# Patient Record
Sex: Male | Born: 2000 | Race: White | Hispanic: No | Marital: Single | Smoking: Never smoker
Health system: Southern US, Community
[De-identification: ages and names within clinical notes are randomized; demographics above are authoritative.]

## PROBLEM LIST (undated history)

## (undated) DIAGNOSIS — F909 Attention-deficit hyperactivity disorder, unspecified type: Secondary | ICD-10-CM

## (undated) DIAGNOSIS — T7840XA Allergy, unspecified, initial encounter: Secondary | ICD-10-CM

## (undated) DIAGNOSIS — J302 Other seasonal allergic rhinitis: Secondary | ICD-10-CM

## (undated) DIAGNOSIS — R569 Unspecified convulsions: Secondary | ICD-10-CM

## (undated) HISTORY — PX: INGUINAL HERNIA REPAIR: SUR1180

## (undated) HISTORY — PX: CIRCUMCISION: SUR203

## (undated) HISTORY — DX: Attention-deficit hyperactivity disorder, unspecified type: F90.9

## (undated) HISTORY — PX: HERNIA REPAIR: SHX51

## (undated) HISTORY — PX: TESTICLE SURGERY: SHX794

## (undated) HISTORY — DX: Allergy, unspecified, initial encounter: T78.40XA

## (undated) HISTORY — PX: TYMPANOSTOMY TUBE PLACEMENT: SHX32

## (undated) HISTORY — DX: Unspecified convulsions: R56.9

---
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
## 2022-01-01 ENCOUNTER — Ambulatory Visit (INDEPENDENT_AMBULATORY_CARE_PROVIDER_SITE_OTHER): Payer: BC Managed Care – PPO | Admitting: Pediatrics

## 2022-01-01 VITALS — Ht 64.5 in | Wt 113.0 lb

## 2022-01-01 DIAGNOSIS — R634 Abnormal weight loss: Secondary | ICD-10-CM

## 2022-01-01 DIAGNOSIS — F902 Attention-deficit hyperactivity disorder, combined type: Secondary | ICD-10-CM

## 2022-01-01 DIAGNOSIS — F4325 Adjustment disorder with mixed disturbance of emotions and conduct: Secondary | ICD-10-CM

## 2022-01-01 DIAGNOSIS — F819 Developmental disorder of scholastic skills, unspecified: Secondary | ICD-10-CM | POA: Diagnosis not present

## 2022-01-01 DIAGNOSIS — R278 Other lack of coordination: Secondary | ICD-10-CM

## 2022-01-01 DIAGNOSIS — Z79899 Other long term (current) drug therapy: Secondary | ICD-10-CM

## 2022-01-01 MED ORDER — METHYLPHENIDATE HCL ER (CD) 50 MG PO CPCR: 50.0000 mg | ORAL_CAPSULE | ORAL | 0 refills | Status: DC

## 2022-01-01 NOTE — Patient Instructions (Addendum)
[place] [phone] [address] Barbara Cower [phone]   You can request more information at this web address: [email or web address]  Counselors that might have sliding scale fees  [facility] [place].  "[facility]"    [facility] [place]. [address]    [place]  [place] [phone]

## 2022-01-01 NOTE — Progress Notes (Signed)
[facility] [place]. [address] [place] [address] Dept: [phone] Dept Fax: [phone]  Medication Check  Patient ID:  Jonathan Scott  male DOB: 2000-04-18   21 y.o.   MRN: [id number]   DATE:01/01/22  PCP: Christain Sacramento, MD  Accompanied by: Mother  HISTORY/CURRENT STATUS: Jonathan Scott is here for medication management of the psychoactive medications for ADHD with anxiety/depression with adjustment disorder and review of educational and behavioral concerns. Jonathan Scott is currently taking Metadate CD 50 Q AM. Has been able to get the prescription filled now. Takes it about 9-10:30 and eats breakfast. Only eats lunch part of the time. He eats dinner with his mother and eats a good size dinner. Most days he only eats 2 meals. Listens to podcasts/music, helps mom with projects around the house. He has been studying for his drivers test. Medicine wears off about 6-7 PM. Thinking about getting a job, but wants to be hired where the boss treats the workers right.   Sleeping well (watches TV, goes to bed at 11-12 am asleep before 1 PM, wakes at 9:30 AM), sleeping through the night. Does have delayed sleep onset.   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 Contact information was given him at the last visit but he has not contacted VOC Rehab. Mother wants him to have updated Psychoeducational testing and discussed this is where to get the help that Jonathan Scott needs. Counseling provided  Thinking about jobs at [employer], [employer] or [employer].  MEDICAL HISTORY: Individual Medical History/ Review of Systems: Healthy,  has needed no trips to the PCP. NO Seizures.  [place] done last year  Family Medical/ Social History: Patient Lives with: mother Mother is unemployed now.   MENTAL HEALTH: Mental Health Issues:   Depression and Anxiety Times feeling depressed, "why am I even here?".  Wonders why he is so depressed Anxious, too.  Worried about his mom being out of work.  Worrying about her health. Completed the PHQ-9 depression screener with a total score of 10 (moderate depression).  Completed the GAD-7 anxiety screener with a total score of 8 (mild anxiety). Worried about money since mother is out of work, worried cannot afford to get therapist, worried about the insurance Discussed that some counselors take "sliding scale" patients when money is tight or no insurance. Encouraged Jonathan Scott to reach out to some community providers to see if they provide "sliding scale" care     01/01/2022    3:48 PM 07/02/2021    4:54 PM 04/22/2021   11:24 AM  PHQ9 SCORE ONLY  PHQ-9 Total Score 10 10 5       01/01/2022    3:50 PM 07/02/2021    4:55 PM 04/22/2021   11:24 AM 01/03/2021    4:42 PM  GAD 7 : Generalized Anxiety Score  Nervous, Anxious, on Edge 1 2 1 1   Control/stop worrying 1 1 1  0  Worry too much - different things 1 1 1 1   Trouble relaxing 1 1 1 1   Restless 1 1 0 0  Easily annoyed or irritable 2 2 3 2   Afraid - awful might happen  1 1 2 1   Total GAD 7 Score 8 9 9 6    Allergies: Allergies  Allergen Reactions   Amoxicillin Rash and Swelling    Current Medications:  Current Outpatient Medications on File Prior to Visit  Medication Sig Dispense Refill   diazepam (DIASTAT ACUDIAL) 10 MG GEL GIVE 10 MG RECTUALLY FOR SEIZURES THAT LAST 2 MIN OR MORE (Patient not taking: Reported on 05/30/2020) 10 mg 0   levETIRAcetam (KEPPRA) 500 MG tablet TAKE 1&1/2 TABLETS TWICE DAILY 270 tablet 1   methylphenidate (METADATE CD) 50 MG CR capsule Take 1 capsule (50 mg total) by mouth every morning. 30 capsule 0   methylphenidate  (RITALIN) 5 MG tablet Take 1 tablet (5 mg total) by mouth as directed. Daily at 3-5 PM for homework or behavior 30 tablet 0   No current facility-administered medications on file prior to visit.    Medication Side Effects: Appetite Suppression and Sleep Problems  DIAGNOSES:    ICD-10-CM   1. ADHD (attention deficit hyperactivity disorder), combined type  F90.2 methylphenidate (METADATE CD) 50 MG CR capsule    2. General learning disability  F81.9     3. 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 Loss of weight  R63.4     6. Medication management  Z79.899      ASSESSMENT:  ADHD well controlled with medication management, however, continues to have side effects of medication, i.e., sleep and appetite concerns with weight loss. Anxiety and Depression are keeping him from following through on plans for young adult independence activities. Recommend individual counseling, and connection with 06/01/2020 for Psychoeducational Testing, Job evaluation and training. Jonathan Scott allowed his mother into the appointment today so she could hear the recommendations, too.   RECOMMENDATIONS:  Discussed recent history and today's examination with patient/parent. Have considered a trial of SSRI for anxiety/depression but most are contraindicated in patients with seizures. 11-10-1999 NP in [facility] will help with selection if needed.   Counseled regarding  growth and development.   7 %ile (Z= -1.51) based on CDC (Boys, 2-20 Years) BMI-for-age based on BMI available as of 01/01/2022. Will continue to monitor.   Recommended contacting vocational rehabilitation for psychoeducational evaluation, job evaluation, job [facility].  Recommended Jonathan Scott seek individual counseling from a counselor who provides sliding-scale fees.  Discussed this concept with the patient and his mother.  Discussed need for bedtime  routine, use of good sleep hygiene, no video games, TV or phones for an hour before bedtime.   Counseled medication pharmacokinetics, options, dosage, administration, desired effects, and possible side effects.   Metadate CD 50 mg every morning after breakfast E-Prescribed  directly to  [id number] [place] [place] - [address] [place] [address] [place] Phone: [phone] Fax: [phone]  REVIEW OF CHART, FACE TO FACE CLINIC TIME AND DOCUMENTATION TIME DURING TODAY'S VISIT:  30 min     NEXT APPOINTMENT: Return to PCP for health monitoring and medication management.

## 2022-01-21 ENCOUNTER — Other Ambulatory Visit (INDEPENDENT_AMBULATORY_CARE_PROVIDER_SITE_OTHER): Payer: Self-pay | Admitting: Family

## 2022-01-21 DIAGNOSIS — G40309 Generalized idiopathic epilepsy and epileptic syndromes, not intractable, without status epilepticus: Secondary | ICD-10-CM

## 2022-01-21 NOTE — Telephone Encounter (Signed)
Last seen 12/2020 no showed last appt and not rescheduled. Requested front call and sched prior to refilling med

## 2022-02-03 ENCOUNTER — Other Ambulatory Visit: Payer: Self-pay

## 2022-02-03 DIAGNOSIS — F902 Attention-deficit hyperactivity disorder, combined type: Secondary | ICD-10-CM

## 2022-02-03 MED ORDER — METHYLPHENIDATE HCL ER (CD) 50 MG PO CPCR: 50.0000 mg | ORAL_CAPSULE | ORAL | 0 refills | Status: DC

## 2022-02-03 MED ORDER — METHYLPHENIDATE HCL 5 MG PO TABS: 5.0000 mg | ORAL_TABLET | ORAL | 0 refills | Status: DC

## 2022-02-03 NOTE — Telephone Encounter (Signed)
E-Prescribed Metadate CD 50 and Ritalin 5 directly to  [id number] [place], [place] - [address] [place] [address] Phone: [phone] Fax: [phone]

## 2022-03-19 ENCOUNTER — Other Ambulatory Visit: Payer: Self-pay

## 2022-03-19 DIAGNOSIS — F902 Attention-deficit hyperactivity disorder, combined type: Secondary | ICD-10-CM

## 2022-03-19 MED ORDER — METHYLPHENIDATE HCL ER (CD) 50 MG PO CPCR: 50.0000 mg | ORAL_CAPSULE | ORAL | 0 refills | Status: AC

## 2022-03-19 MED ORDER — METHYLPHENIDATE HCL 5 MG PO TABS: 5.0000 mg | ORAL_TABLET | ORAL | 0 refills | Status: DC

## 2022-03-19 NOTE — Telephone Encounter (Signed)
RX for above e-scribed and sent to pharmacy on record  [id number] [place], [place] [address] Phone: [phone] Fax: [phone]

## 2022-04-10 ENCOUNTER — Institutional Professional Consult (permissible substitution): Payer: BC Managed Care – PPO | Admitting: Pediatrics

## 2022-04-16 ENCOUNTER — Other Ambulatory Visit (INDEPENDENT_AMBULATORY_CARE_PROVIDER_SITE_OTHER): Payer: Self-pay | Admitting: Family

## 2022-04-16 DIAGNOSIS — G40309 Generalized idiopathic epilepsy and epileptic syndromes, not intractable, without status epilepticus: Secondary | ICD-10-CM

## 2022-04-17 ENCOUNTER — Telehealth (INDEPENDENT_AMBULATORY_CARE_PROVIDER_SITE_OTHER): Payer: Self-pay

## 2022-04-17 NOTE — Telephone Encounter (Signed)
error 

## 2022-05-01 ENCOUNTER — Ambulatory Visit (INDEPENDENT_AMBULATORY_CARE_PROVIDER_SITE_OTHER): Payer: BC Managed Care – PPO | Admitting: Family

## 2022-05-01 ENCOUNTER — Encounter (INDEPENDENT_AMBULATORY_CARE_PROVIDER_SITE_OTHER): Payer: Self-pay | Admitting: Family

## 2022-05-01 VITALS — BP 112/68 | HR 64 | Ht 64.17 in | Wt 109.4 lb

## 2022-05-01 DIAGNOSIS — G250 Essential tremor: Secondary | ICD-10-CM | POA: Diagnosis not present

## 2022-05-01 DIAGNOSIS — F819 Developmental disorder of scholastic skills, unspecified: Secondary | ICD-10-CM | POA: Diagnosis not present

## 2022-05-01 DIAGNOSIS — G40309 Generalized idiopathic epilepsy and epileptic syndromes, not intractable, without status epilepticus: Secondary | ICD-10-CM | POA: Diagnosis not present

## 2022-05-01 MED ORDER — LEVETIRACETAM 500 MG PO TABS: ORAL_TABLET | ORAL | 3 refills | Status: DC
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
## 2022-05-01 NOTE — Patient Instructions (Signed)
It was a pleasure to see you today!  Instructions for you until your next appointment are as follows: Continue taking your medication as prescribed Let me know if you have any seizures If you receive a form from the [facility] for driving, bring it to this office and I will complete it and send it in for you.  Please sign up for MyChart if you have not done so. Please plan to return for follow up in one year or sooner if needed.  Feel free to contact our office during normal business hours at [phone] with questions or concerns. If there is no answer or the call is outside business hours, please leave a message and our clinic staff will call you back within the next business day.  If you have an urgent concern, please stay on the line for our after-hours answering service and ask for the on-call neurologist.     I also encourage you to use MyChart to communicate with me more directly. If you have not yet signed up for MyChart within [facility], the front desk staff can help you. However, please note that this inbox is NOT monitored on nights or weekends, and response can take up to 2 business days.  Urgent matters should be discussed with the on-call pediatric neurologist.   At [facility], we are committed to providing exceptional care. You will receive a patient satisfaction survey through text or email regarding your visit today. Your opinion is important to me. Comments are appreciated.

## 2022-06-04 DIAGNOSIS — F902 Attention-deficit hyperactivity disorder, combined type: Secondary | ICD-10-CM | POA: Diagnosis not present

## 2022-06-04 DIAGNOSIS — R569 Unspecified convulsions: Secondary | ICD-10-CM | POA: Diagnosis not present

## 2022-07-03 ENCOUNTER — Institutional Professional Consult (permissible substitution): Payer: BC Managed Care – PPO | Admitting: Pediatrics

## 2022-09-10 DIAGNOSIS — F902 Attention-deficit hyperactivity disorder, combined type: Secondary | ICD-10-CM | POA: Diagnosis not present

## 2022-09-10 DIAGNOSIS — R569 Unspecified convulsions: Secondary | ICD-10-CM | POA: Diagnosis not present

## 2022-12-16 DIAGNOSIS — F902 Attention-deficit hyperactivity disorder, combined type: Secondary | ICD-10-CM | POA: Diagnosis not present

## 2023-03-17 DIAGNOSIS — F902 Attention-deficit hyperactivity disorder, combined type: Secondary | ICD-10-CM | POA: Diagnosis not present

## 2023-04-29 ENCOUNTER — Ambulatory Visit (INDEPENDENT_AMBULATORY_CARE_PROVIDER_SITE_OTHER): Payer: BC Managed Care – PPO | Admitting: Family

## 2023-04-29 ENCOUNTER — Encounter (INDEPENDENT_AMBULATORY_CARE_PROVIDER_SITE_OTHER): Payer: Self-pay | Admitting: Family

## 2023-04-29 VITALS — BP 90/70 | HR 74 | Ht 64.29 in | Wt 107.0 lb

## 2023-04-29 DIAGNOSIS — G40309 Generalized idiopathic epilepsy and epileptic syndromes, not intractable, without status epilepticus: Secondary | ICD-10-CM

## 2023-04-29 DIAGNOSIS — G250 Essential tremor: Secondary | ICD-10-CM

## 2023-04-29 MED ORDER — VALTOCO 15 MG DOSE 7.5 MG/0.1ML NA LQPK: NASAL | 3 refills | Status: AC

## 2023-04-29 MED ORDER — LEVETIRACETAM 500 MG PO TABS: ORAL_TABLET | ORAL | 1 refills | Status: DC
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
## 2023-04-29 NOTE — Patient Instructions (Addendum)
 It was a pleasure to see you today!  Instructions for you until your next appointment are as follows: I have refilled the Levetiracetam. Remember that it is important for you to not to miss any doses and to get at least 8 hours of sleep each night I changed the rectal Diastat to Valtoco nasal spray for rescue treatment of seizures. If a seizure happens that lasts 2 minutes or longer, give 1 spray in each nostril.  You can give 1 more dose in 4 hours if needed for seizures I have placed a referral to Dr Patrcia Dolly with [facility]. That office will call you for an appointment. The number there is [phone] if you want to call them to schedule. Please sign up for MyChart if you have not done so. Please call me for any questions or concerns before you get established with Dr Karel Jarvis  Feel free to contact our office during normal business hours at [phone] with questions or concerns. If there is no answer or the call is outside business hours, please leave a message and our clinic staff will call you back within the next business day.  If you have an urgent concern, please stay on the line for our after-hours answering service and ask for the on-call neurologist.     I also encourage you to use MyChart to communicate with me more directly. If you have not yet signed up for MyChart within [facility], the front desk staff can help you. However, please note that this inbox is NOT monitored on nights or weekends, and response can take up to 2 business days.  Urgent matters should be discussed with the on-call pediatric neurologist.   At [facility], we are committed to providing exceptional care. You will receive a patient satisfaction survey through text or email regarding your visit today. Your opinion is important to me. Comments are appreciated.

## 2023-05-01 ENCOUNTER — Encounter (INDEPENDENT_AMBULATORY_CARE_PROVIDER_SITE_OTHER): Payer: Self-pay | Admitting: Family

## 2023-10-29 ENCOUNTER — Other Ambulatory Visit (INDEPENDENT_AMBULATORY_CARE_PROVIDER_SITE_OTHER): Payer: Self-pay | Admitting: Family

## 2023-10-29 ENCOUNTER — Encounter: Payer: Self-pay | Admitting: Neurology

## 2023-10-29 DIAGNOSIS — G40309 Generalized idiopathic epilepsy and epileptic syndromes, not intractable, without status epilepticus: Secondary | ICD-10-CM

## 2023-10-29 NOTE — Telephone Encounter (Signed)
 Contacted patient.  Verified his name and DOB.   Conferenced a call with [facility] to get appointment scheduled. Representative stated that the referral is only good for 90 days and has expired. Representative asked for a new referral to be sent.   Patients mother was present for this call.  Mom asked the refills be sent until he is able to be seen by the new provider.   SS, CCMA

## 2023-10-29 NOTE — Telephone Encounter (Signed)
 I sent in the refill and put in new referral for [facility].

## 2023-11-30 ENCOUNTER — Ambulatory Visit: Admitting: Neurology

## 2023-11-30 ENCOUNTER — Encounter: Payer: Self-pay | Admitting: Neurology

## 2023-11-30 VITALS — BP 129/80 | HR 72 | Ht 64.0 in | Wt 110.0 lb

## 2023-11-30 DIAGNOSIS — R519 Headache, unspecified: Secondary | ICD-10-CM

## 2023-11-30 DIAGNOSIS — R292 Abnormal reflex: Secondary | ICD-10-CM | POA: Diagnosis not present

## 2023-11-30 DIAGNOSIS — G40309 Generalized idiopathic epilepsy and epileptic syndromes, not intractable, without status epilepticus: Secondary | ICD-10-CM

## 2023-11-30 MED ORDER — LEVETIRACETAM 750 MG PO TABS: 750.0000 mg | ORAL_TABLET | Freq: Two times a day (BID) | ORAL | 3 refills | Status: AC

## 2023-11-30 NOTE — Patient Instructions (Signed)
 Good to meet you.   Schedule MRI brain with and without contrast  2. A prescription was sent for Levetiracetam  (Keppra ) 750mg : Take 1 tablet twice a day. Let me know if any issues.  3. Follow-up in 1 year, call for any changes   Seizure Precautions: 1. If medication has been prescribed for you to prevent seizures, take it exactly as directed.  Do not stop taking the medicine without talking to your doctor first, even if you have not had a seizure in a long time.   2. Avoid activities in which a seizure would cause danger to yourself or to others.  Don't operate dangerous machinery, swim alone, or climb in high or dangerous places, such as on ladders, roofs, or girders.  Do not drive unless your doctor says you may.  3. If you have any warning that you may have a seizure, lay down in a safe place where you can't hurt yourself.    4.  No driving for 6 months from last seizure, as per [place]  state law.   Please refer to the following link on the Epilepsy Foundation of America's website for more information: http://www.epilepsyfoundation.org/answerplace/Social/driving/drivingu.cfm   5.  Maintain good sleep hygiene.  6.  Contact your doctor if you have any problems that may be related to the medicine you are taking.  7.  Call 911 and bring the patient back to the ED if:        A.  The seizure lasts longer than 5 minutes.       B.  The patient doesn't awaken shortly after the seizure  C.  The patient has new problems such as difficulty seeing, speaking or moving  D.  The patient was injured during the seizure  E.  The patient has a temperature over 102 F (39C)  F.  The patient vomited and now is having trouble breathing

## 2023-11-30 NOTE — Progress Notes (Unsigned)
 NEUROLOGY CONSULTATION NOTE  Jonathan Scott MRN: [id number] DOB: 05/30/2000  Referring provider: Ellouise Bollman, NP Primary care provider: Dr. Prentice Blush  Reason for consult:  establish adult epilepsy care  Dear Ellouise:  Thank you for your kind referral of Jonathan Scott for consultation of the above symptoms. Although his history is well known to you, please allow me to reiterate it for the purpose of our medical record. The patient was accompanied to the clinic by his mother who also provides collateral information. Records and images were personally reviewed where available.   HISTORY OF PRESENT ILLNESS: This is a 23 year old right-handed man with a history of ADHD and learning difficulties, presenting to establish adult epilepsy care. Records from [facility] were reviewed, he was last seen in 04/2023. He had 6 or 7 seizures at age 6 with elevated fever. He also had nonfebrile seizures in 2006 where he was found with a glazed look, unresponsive, limp for a few minutes then sleepy afterwards. He had a prolonged EEG at [facility] that was normal. Routine EEG in 2007 also normal. Family decided against treating with seizure medication and he went 10 years seizure-free until a seizure at school in 12/2014. He was reported to have staring followed by a convulsion. EEG in 12/2014 showed evidence of repeated photo-myoclonic responses with photic stimulation. No photo-convulsive responses, normal baseline EEG otherwise. They decided to wait until he had another seizure that occurred in 02/2015 on the bus. He was started on Lamotrigine  but continued to have seizures that were better controlled with switch to Levetiracetam  in 04/2017. After this, his mother reports a nocturnal seizure, he recalls it was in 10/2019 and she found he was missing doses. There is note of last seizure being 11/2019 while having his wisdom tooth extracted, no missed medication, he was reporting trouble sleeping. He has  been on Levetiracetam  750mg  BID (500mg : 1.5 tabs BID), sometimes he gets a little dizzy after taking it.  He denies any prior warning symptoms to the seizures. He has been told he goes in a stare then wakes up on the ground with tongue bite or incontinence. His mother has not witnessed any further staring episodes. He denies any gaps in time, olfactory/gustatory hallucinations, focal numbness/tingling/weakness, myoclonic jerks. No headaches, dizziness, vision changes, no falls. Sleep and mood are good.     RH No warning, at school told he would be sitting in clss then goes into stare and wake up on floor; bit tongue or incont Has had nocturnal seizure Sometimes dizzy after taking med, not long Once in a while quick jerk in hand, sometimes legs No paresthesias ROS: had a HA recently last week Sunday, lasted 3 days then stopped Sat; went to [facility], could be tension or allergies; started in the naseal frontal, shifting to either side, no n/v, a little sensitive to lights/sounds, a little tired; only worst but has had HAs in the past, sometimes once a month/or once a week 9-10hrs sleep Mood tired I'm not a morning person, later on mood changes Methylphenidate  supposed everyday but sometimes dont take Lives with mom; not driving Trying to apply but it has been hard; 2 learning disabilities, has appt to have him tested A little bit of anxiety, no psych  Mom: Paternal [place] uncle; mother had sz in childhood,  Staring and some shaking; nocturnal sz 10/2019 was missing doses for 2 weeks Good with taking meds Memeory not good Brisk +2 neg hoffman, 5/5, 3/3 3 beats clonus  bilat    Today's concerns: He has remained seizure free since his last visit. He reports being compliant with medication and typically gets enough sleep.  Not driving. He has learners permit but is not interested in getting a drivers license as he is fearful of having a seizure event while driving.  He is not working. 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   seizure on November 11, 2019 while at a dental visit to have his wisdom teeth extracted. This is his first seizure since February 2019. He says that he has been compliant with medication but admits that he has trouble sleeping at times.    Seizure symptoms: The patient denies any olfactory/gustatory hallucinations, deja vu, rising epigastric sensation, focal numbness/tingling/weakness, myoclonic jerks.  Epilepsy Risk Factors:  *** had a normal birth and early development.  There is no history of febrile convulsions, CNS infections such as meningitis/encephalitis, significant traumatic brain injury, neurosurgical procedures, or family history of seizures.  Prior AEDs: Laboratory Data:  EEGs: MRI:   PAST MEDICAL HISTORY: Past Medical History:  Diagnosis Date   ADHD (attention deficit hyperactivity disorder)    Allergy    seasonal   Seasonal allergies    Seizures (HCC)    LAst Seizure in JAn 2019    PAST SURGICAL HISTORY: Past Surgical History:  Procedure Laterality Date   AMPUTATION Left 09/04/2017   Procedure: AMPUTATION LEFT INDEX FINGER;  Surgeon: Murrell Kuba, MD;  Location: [facility];  Service: Orthopedics;  Laterality: Left;   CIRCUMCISION     HERNIA REPAIR     with testicular surgery   INGUINAL HERNIA REPAIR     bilateral hernia repair with testicular surgery   TESTICLE SURGERY     [facility]   TYMPANOSTOMY TUBE PLACEMENT     x2    MEDICATIONS: Current Outpatient Medications on File Prior to Visit  Medication Sig Dispense Refill   levETIRAcetam  (KEPPRA ) 500 MG tablet TAKE 1&1/2 TABLETS TWICE DAILY 270 tablet 0   methylphenidate  (METADATE  CD) 50 MG CR capsule Take 1 capsule (50 mg total) by mouth every morning. 30 capsule 0   VALTOCO  15 MG DOSE 7.5 MG/0.1ML LQPK Give 1 spray in each nostril for seizures lasting 2 minutes or longer. May repeat in 4 hours if seizures recur. 2 each 3   No current  facility-administered medications on file prior to visit.    ALLERGIES: Allergies  Allergen Reactions   Amoxicillin Rash and Swelling    FAMILY HISTORY: Family History  Problem Relation Age of Onset   Aneurysm Paternal Grandfather 52   Hypertension Mother    Asthma Father    Seizures Other    Seizures Other     SOCIAL HISTORY: Social History   Socioeconomic History   Marital status: Single    Spouse name: Not on file   Number of children: Not on file   Years of education: Not on file   Highest education level: Not on file  Occupational History   Not on file  Tobacco Use   Smoking status: Never   Smokeless tobacco: Never  Vaping Use   Vaping status: Never Used  Substance and Sexual Activity   Alcohol use: No    Alcohol/week: 0.0 standard drinks of alcohol   Drug use: No   Sexual activity: Never  Other Topics Concern   Not on file  Social History Narrative   [place]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    He graduated from [employer].    He lives with his mom.   He enjoys video games, Lego's, tag football, and [employer].    Social Drivers of Corporate investment banker Strain: Not on file  Food Insecurity: Low Risk  (10/28/2023)   Received from [facility]   Hunger Vital Sign    Within the past 12 months, you worried that your food would run out before you got money to buy more: Never true    Within the past 12 months, the food you bought just didn't last and you didn't have money to get more. : Never true  Transportation Needs: No Transportation Needs (10/28/2023)   Received from [facility]    In the past 12 months, has lack of reliable transportation kept you from medical appointments, meetings, work or from getting things needed for daily living? : No  Physical Activity: Not on file  Stress: Not on file  Social Connections: Not on file  Intimate Partner Violence: Not on file     PHYSICAL EXAM: Vitals:   11/30/23  1010  BP: 129/80  Pulse: 72  SpO2: 98%   General: No acute distress Head:  Normocephalic/atraumatic Skin/Extremities: No rash, no edema Neurological Exam: Mental status: alert and oriented to person, place, and time, no dysarthria or aphasia, Fund of knowledge is appropriate.  Recent and remote memory are intact.  Attention and concentration are normal.    Able to name objects and repeat phrases. Cranial nerves: CN I: not tested CN II: pupils equal, round and reactive to light, visual fields intact CN III, IV, VI:  full range of motion, no nystagmus, no ptosis CN V: facial sensation intact CN VII: upper and lower face symmetric CN VIII: hearing intact to conversation Bulk & Tone: normal, no fasciculations. Motor: 5/5 throughout with no pronator drift. Sensation: intact to light touch, cold, pin, vibration and joint position sense.  No extinction to double simultaneous stimulation.  Romberg test *** Deep Tendon Reflexes: +2 throughout, no ankle clonus Plantar responses: downgoing bilaterally Cerebellar: no incoordination on finger to nose, heel to shin. No dysdiadochokinesia Gait: narrow-based and steady, able to tandem walk adequately. Tremor: ***   IMPRESSION: This is a *** year old ***-handed *** with a history of ***.  [place] driving laws were discussed with the patient, and *** knows to stop driving after a seizure, until 6 months seizure-free.    The duration of this appointment visit was *** minutes of face-to-face time with the patient.  Greater than 50% of this time was spent in counseling, explanation of diagnosis, planning of further management, and coordination of care.  Thank you for allowing me to participate in the care of this patient. Please do not hesitate to call for any questions or concerns.   Darice Shivers, M.D.  CC: ***

## 2024-01-19 ENCOUNTER — Encounter: Payer: Self-pay | Admitting: Neurology

## 2024-03-18 ENCOUNTER — Encounter: Payer: Self-pay | Admitting: Psychology

## 2024-03-18 ENCOUNTER — Ambulatory Visit: Admitting: Psychology

## 2024-03-18 DIAGNOSIS — F900 Attention-deficit hyperactivity disorder, predominantly inattentive type: Secondary | ICD-10-CM | POA: Diagnosis not present

## 2024-03-18 DIAGNOSIS — F419 Anxiety disorder, unspecified: Secondary | ICD-10-CM | POA: Diagnosis not present

## 2024-03-18 DIAGNOSIS — F339 Major depressive disorder, recurrent, unspecified: Secondary | ICD-10-CM | POA: Diagnosis not present

## 2024-03-18 NOTE — Progress Notes (Signed)
 Photographer Health Counselor Initial Adult Exam  Name: Jonathan Scott Date: 03/18/2024 MRN: [id number] DOB: Mar 05, 2000 PCP: Tanda Prentice DEL, MD  Time spent: 9:30 - 10:25 am  Guardian/Informant:  Jonathan Scott - mother   Jonathan Scott - Patient   Paperwork requested: No  Met with patient and parent (mother) for initial interview.  Patient and parent were at home and session was conducted from therapist's office via video conferencing.  Patient and parent expressed understanding of the limitations of video sessions and verbally consented to telehealth.    Reason for Visit /Presenting Problem: Referred for ADHD/pysch-educational testing.  Mother reported that patient is 24 year old and he's applying for jobs.  He has to take tests for these jobs and he has been failing them.  Patient has a history of learning disabilities at school.  He can only do one task at a time, cannot do multi-step tasks,  and forgets frequently.  He has a history of seizures and his doctors believe that the seizures impact his memory.   Mental Status Exam: Appearance:   Casual and Fairly Groomed     Behavior:  Appropriate  Motor:  Restlestness  Speech/Language:   Normal Rate and Occasional stutter/stammer  Affect:  Constricted  Mood:  euthymic  Thought process:  blocked  Thought content:    WNL  Sensory/Perceptual disturbances:    WNL  Orientation:  oriented to person, place, time/date, and situation  Attention:  Good  Concentration:  Good  Memory:  WNL  Fund of knowledge:   Fair  Insight:    Fair  Judgment:   Good  Impulse Control:  Good   Developmental History: Early delays - Has a history of learning delays.  Was not able to stay on task since K. He was help back for a second year of K due to lack of progress.  Had IEP for learning deficits and speech delays.    Motor - Good coordination and movement.  No sports or physical activity.  Handwriting is neat and legible.  Adequate fine motor in  general.   Speech - speech is clear, but there is a slight stutter at times.  Had speech therapy during day care and elementary school.   Self Care - Adequate Independent - Been hesitant to get driver's license due to concerns about having a seizure while driving.  Doesn't do much independent activity.  Mostly stays at home and mother must motivate him to leave the house.  Doesn't have a job.  Only household chores are taking out garbage, cleaning the cat box, and folding clothes. Social - Has close friendships.  Play video games with friends online.  Meets some of his friends in person.     Reported Symptoms:  Trouble falling asleep.  Plays video games late into the night next morning then sleeps during the day.  Trying to cut back on playing video games.  No recent changes in appetite.  Inconsistent energy during the day, gets tired often.  Occasional sadness and depressed mood.  Last occurrence within the past week.  Overly negative and irritable during these times.  Frustrated about getting rejected for jobs.  Parents divorced in 2023.  Resentful toward father and currently chooses not to have contact with him.  No mania but overly energetic at times.  No sudden anxiety/panic.  No specific worries or fears.  Has general worry.  Some social-performance anxiety.  Has intrusive thought and compulsive collecting.  Trouble paying attention.  Easily distracted.  Frequent losing and forgetting.  Poor organization but working on improving it. Frequent restlessness and fidgeting.  Verbally impulsive and some impulsive behavior.  Gets along adequately with peers.  Adequate with reading nonverbal cues.  Has close friendships.  Some repetitive speech and behavior.  Overly intense interest in horror games and collection of Godzilla figures.  Able to cope with change and transition.  Strobe lights lead to seizures.  Sensitive to clothing texture along with too many things in visual field and bright lights.            Risk Assessment: Danger to Self:  No Self-injurious Behavior: No Danger to Others: No Duty to Warn:no Physical Aggression / Violence:No  Access to Firearms a concern: No  Gang Involvement:No  Patient / guardian was educated about steps to take if suicide or homicide risk level increases between visits: n/a While future psychiatric events cannot be accurately predicted, the patient does not currently require acute inpatient psychiatric care and does not currently meet [place]  involuntary commitment criteria.  Substance Abuse History: Current substance abuse: No     Past Psychiatric History:   Previous psychological history is significant for ADHD Outpatient Providers:Previously attend psychotherapy.  Last tome attended was last year for one session but wasn't comfortable with the therapist.  No psychiatrist.   History of Psych Hospitalization: No  Psychological Testing: Attention/ADHD:  During elementary school years.     Abuse History:  Victim of: No., None   Report needed: No. Victim of Neglect:No. Perpetrator of None  Witness / Exposure to Domestic Violence: No   Protective Services Involvement: No  Witness to [employer] Violence:  No   Family History:  Family History  Problem Relation Age of Onset   Aneurysm Paternal Grandfather 1   Hypertension Mother    Asthma Father    Seizures Other    Seizures Other   Mother's side - 2 uncles developmental delays and likely had ADHD, anxiety, and depression.    Living situation: the patient lives with their family (mother).  Parents divorced in 2023.  Has older sister (34 years) who lives away from home.  Another sister (born in 29) died after 18 days.  Good relations with family other than father.    Sexual Orientation: Straight  Relationship Status: single  Name of spouse / other:None - Previously dated someone several years ago for about one week.   If a parent, number of children / ages:None  Support Systems:  Close friends Parent (mother)  Financial Stress:  Yes  - Mother not currently able to work and trying to get disability payment.  Patient worries ability to get bills paid.    Income/Employment/Disability: No income. No previous jobs.  Applied for several jobs but not able to get any.  Interested in any type of job.    Military Service: No   Educational History: Education: high school diploma/GED - Regular diploma.  Had an IEP throughout school.  Was given read aloud services for testing and had speech therapy during elementary school.  Most enjoyed social worker.  Below grade level for reading. No interesting college or trade/technical school.    Recreation/Hobbies: Video games and spending time with friends.    Stressors: Financial difficulties   Occupational concerns  - finding a job  Strengths: Scientist, research (life sciences).  Doesn't do much activity outside of playing games and watching television.    Barriers:  Patient unsure of barriers.     Legal History: Pending legal  issue / charges: The patient has no significant history of legal issues.  Medical History/Surgical History: reviewed Past Medical History:  Diagnosis Date   ADHD (attention deficit hyperactivity disorder)    Allergy    seasonal   Seasonal allergies    Seizures (HCC)    LAst Seizure in JAn 2019  No other medical conditions.  Past Surgical History:  Procedure Laterality Date   AMPUTATION Left 09/04/2017   Procedure: AMPUTATION LEFT INDEX FINGER;  Surgeon: Murrell Kuba, MD;  [facility];  Service: Orthopedics;  Laterality: Left;   CIRCUMCISION     HERNIA REPAIR     with testicular surgery   INGUINAL HERNIA REPAIR     bilateral hernia repair with testicular surgery   TESTICLE SURGERY     [facility]   TYMPANOSTOMY TUBE PLACEMENT     x2    Medications: Current Outpatient Medications  Medication Sig Dispense Refill   levETIRAcetam  (KEPPRA ) 750 MG tablet Take 1 tablet (750 mg total) by mouth 2  (two) times daily. 180 tablet 3   methylphenidate  (METADATE  CD) 50 MG CR capsule Take 1 capsule (50 mg total) by mouth every morning. 30 capsule 0   VALTOCO  15 MG DOSE 7.5 MG/0.1ML LQPK Give 1 spray in each nostril for seizures lasting 2 minutes or longer. May repeat in 4 hours if seizures recur. 2 each 3   No current facility-administered medications for this visit.  Currently taking above medications, although meta date is currently out of stock.    Allergies[1] & seasonal allergies.  No digestive problems.  No concussions or head injuries.  Last seizure was in 2021.  Taking medication regularly.      Diagnoses:  Attention deficit hyperactivity disorder (ADHD), predominantly inattentive type  Recurrent major depressive disorder, remission status unspecified  Anxiety disorder, unspecified type  Plan of Care: Patient presents with a history of learning and speech delays, along with attention and processing deficits.  He has been previously diagnosed with ADHD, but also experiences significant anxiety and depressed mood.  Patient indicated being most upset currently about his inability to to find a job despite filling out several applications.  Social skills were reported to be adequate, although he currently doesn't socialize much outside of playing video games online and occasionally going out with friends.  Repetitive speech, overly intense interests, intrusive thought, excessive collecting, and sensory hypersensitivity were also reported.  Testing recommended to update neurocognitive, learning skill, social emotional, and adaptive functioning.  Test Battery WAIS-5, [place]-3, BRIEF-2A, BASC-3 (P), Adult OCD, DASS, K-TEA 3 (reading and writing), SRS-2 (S & O).  ADOS-2 Module 4 (optional).    Brayn Eckstein, PhD        [1]  Allergies Allergen Reactions   Amoxicillin Rash and Swelling   "

## 2024-03-18 NOTE — Progress Notes (Signed)
 SABRA

## 2024-03-23 ENCOUNTER — Other Ambulatory Visit: Admitting: Psychology

## 2024-04-04 ENCOUNTER — Other Ambulatory Visit: Admitting: Psychology

## 2024-04-07 ENCOUNTER — Encounter: Payer: Self-pay | Admitting: Psychology

## 2024-04-07 ENCOUNTER — Ambulatory Visit: Admitting: Psychology

## 2024-04-07 DIAGNOSIS — F339 Major depressive disorder, recurrent, unspecified: Secondary | ICD-10-CM

## 2024-04-07 DIAGNOSIS — F900 Attention-deficit hyperactivity disorder, predominantly inattentive type: Secondary | ICD-10-CM

## 2024-04-07 DIAGNOSIS — F419 Anxiety disorder, unspecified: Secondary | ICD-10-CM

## 2024-04-07 NOTE — Progress Notes (Signed)
   Jonathan Dames, PhD

## 2024-04-07 NOTE — Progress Notes (Signed)
"    [facility] Counselor/Therapist Progress Note  Patient ID: Jonathan Scott, MRN: [id number],    Date: 04/07/2024  Time Spent: 12:30 - 3:30pm   Treatment Type: Testing  Met with patient for testing session.  Patient was at the clinic and session was conducted from therapist's office in person.    Reported Symptoms/Reason for Referral: Patient presents with a history of learning and speech delays, along with attention and processing deficits. He has been previously diagnosed with ADHD, but also experiences significant anxiety and depressed mood. Patient indicated being most upset currently about his inability to to find a job despite filling out several applications. Social skills were reported to be adequate, although he currently doesn't socialize much outside of playing video games online and occasionally going out with friends. Repetitive speech, overly intense interests, intrusive thought, excessive collecting, and sensory hypersensitivity were also reported. Testing recommended to update neurocognitive, learning skill, social emotional, and adaptive functioning.   Mental Status Exam: Appearance:  Casual and Fairly Groomed     Behavior: Appropriate  Motor: Normal  Speech/Language:  Clear and Coherent and Normal Rate  Affect: Constricted  Mood: euthymic  Thought process: normal  Thought content:   WNL  Sensory/Perceptual disturbances:   WNL  Orientation: oriented to person, place, time/date, and situation  Attention: Good  Concentration: Good  Memory: WNL  Fund of knowledge:  Fair  Insight:   Fair  Judgment:  Good  Impulse Control: Good   Risk Assessment: Danger to Self:  No Self-injurious Behavior: No Danger to Others: No Duty to Warn:no Physical Aggression / Violence:No   Subjective: Testing included the WAIS-5 Extended (2.5 hrs. for testing and scoring) along with the BASC-3 (0.25 hrs.) and [place] 3 (0.25 hrs.) parent ratings.  The CNS vital signs was not  administered due to patient's history of seizures.  Patient was cooperative and displayed good effort. Attention and concentration were adequate overall, although patient exhibited several instances of either self-correction, asking questions to be repeated, and solving problems correctly after the standardized time limit.  Mood was euthymic with restricted affect.  The results appear representative of current functioning.    Total time for testing: 3.0 hrs.  Diagnosis:Attention deficit hyperactivity disorder (ADHD), predominantly inattentive type  Recurrent major depressive disorder, remission status unspecified  Anxiety disorder, unspecified type  Plan: Testing to continue next session with the BRIEF-2A, BASC-3 (P), Adult OCD, DASS, K-TEA 3 (reading and writing), and SRS-2 (S & O) followed by report writing and interactive feedback.    Oseias Horsey, PhD    "

## 2024-11-25 ENCOUNTER — Ambulatory Visit: Admitting: Neurology
